# Patient Record
Sex: Male | Born: 1960 | Race: Black or African American | Hispanic: No | Marital: Single
Health system: Southern US, Community
[De-identification: ages and names within clinical notes are randomized; demographics above are authoritative.]

---

## 2009-11-28 ENCOUNTER — Emergency Department: Payer: Self-pay | Admitting: Emergency Medicine

## 2012-01-24 ENCOUNTER — Ambulatory Visit: Payer: Self-pay | Admitting: Unknown Physician Specialty

## 2012-01-24 LAB — BASIC METABOLIC PANEL
Anion Gap: 10 (ref 7–16)
Chloride: 107 mmol/L (ref 98–107)
Co2: 23 mmol/L (ref 21–32)
Creatinine: 0.75 mg/dL (ref 0.60–1.30)
EGFR (African American): >60
EGFR (Non-African Amer.): >60
Glucose: 70 mg/dL (ref 65–99)

## 2012-01-24 LAB — HEMOGLOBIN: HGB: 13 g/dL (ref 13.0–18.0)

## 2012-01-31 ENCOUNTER — Ambulatory Visit: Payer: Self-pay | Admitting: Unknown Physician Specialty

## 2012-02-01 LAB — PATHOLOGY REPORT

## 2012-02-16 ENCOUNTER — Ambulatory Visit: Payer: Self-pay | Admitting: Oncology

## 2012-02-16 LAB — CBC CANCER CENTER
Basophil #: 0 x10 3/mm (ref 0.0–0.1)
Eosinophil #: 0.2 x10 3/mm (ref 0.0–0.7)
Lymphocyte #: 3.2 x10 3/mm (ref 1.0–3.6)
Lymphocyte %: 42.8 %
MCHC: 32.2 g/dL (ref 32.0–36.0)
MCV: 91 fL (ref 80–100)
Monocyte %: 5.5 %
Neutrophil #: 3.6 x10 3/mm (ref 1.4–6.5)
Platelet: 192 x10 3/mm (ref 150–440)
RBC: 4.57 10*6/uL (ref 4.40–5.90)
RDW: 14.6 % — ABNORMAL HIGH (ref 11.5–14.5)
WBC: 7.4 x10 3/mm (ref 3.8–10.6)

## 2012-02-16 LAB — COMPREHENSIVE METABOLIC PANEL
Albumin: 3.8 g/dL (ref 3.4–5.0)
Alkaline Phosphatase: 110 U/L (ref 50–136)
Anion Gap: 5 — ABNORMAL LOW (ref 7–16)
BUN: 7 mg/dL (ref 7–18)
Co2: 25 mmol/L (ref 21–32)
Glucose: 80 mg/dL (ref 65–99)
Osmolality: 273 (ref 275–301)
Potassium: 3.8 mmol/L (ref 3.5–5.1)
SGOT(AST): 23 U/L (ref 15–37)
SGPT (ALT): 39 U/L (ref 12–78)
Total Protein: 7.2 g/dL (ref 6.4–8.2)

## 2012-02-23 ENCOUNTER — Ambulatory Visit: Payer: Self-pay | Admitting: Oncology

## 2012-03-06 ENCOUNTER — Ambulatory Visit: Payer: Self-pay | Admitting: Oncology

## 2012-03-19 ENCOUNTER — Ambulatory Visit: Payer: Self-pay | Admitting: Oncology

## 2012-03-19 ENCOUNTER — Ambulatory Visit: Payer: Self-pay | Admitting: Radiation Oncology

## 2012-03-29 LAB — CBC CANCER CENTER
Basophil #: 0.1 x10 3/mm (ref 0.0–0.1)
Basophil %: 0.8 %
Eosinophil #: 0.1 x10 3/mm (ref 0.0–0.7)
HCT: 37.4 % — ABNORMAL LOW (ref 40.0–52.0)
Lymphocyte %: 33.3 %
MCH: 29.6 pg (ref 26.0–34.0)
MCHC: 32.6 g/dL (ref 32.0–36.0)
Monocyte #: 0.3 x10 3/mm (ref 0.2–1.0)
Neutrophil #: 4.4 x10 3/mm (ref 1.4–6.5)
Platelet: 180 x10 3/mm (ref 150–440)
RDW: 14.1 % (ref 11.5–14.5)
WBC: 7.4 x10 3/mm (ref 3.8–10.6)

## 2012-04-06 ENCOUNTER — Ambulatory Visit: Payer: Self-pay | Admitting: Oncology

## 2012-04-12 LAB — CBC CANCER CENTER
Eosinophil #: 0.1 x10 3/mm (ref 0.0–0.7)
Eosinophil %: 1.7 %
HGB: 11.7 g/dL — ABNORMAL LOW (ref 13.0–18.0)
Lymphocyte #: 2.4 x10 3/mm (ref 1.0–3.6)
Lymphocyte %: 32 %
MCH: 29.9 pg (ref 26.0–34.0)
MCHC: 32.7 g/dL (ref 32.0–36.0)
Monocyte #: 0.4 x10 3/mm (ref 0.2–1.0)
Neutrophil %: 60 %
Platelet: 213 x10 3/mm (ref 150–440)
RBC: 3.93 10*6/uL — ABNORMAL LOW (ref 4.40–5.90)
RDW: 13.9 % (ref 11.5–14.5)

## 2012-04-19 LAB — CBC CANCER CENTER
Basophil %: 0.6 %
Eosinophil #: 0.1 x10 3/mm (ref 0.0–0.7)
Eosinophil %: 2.5 %
HCT: 39.5 % — ABNORMAL LOW (ref 40.0–52.0)
HGB: 12.7 g/dL — ABNORMAL LOW (ref 13.0–18.0)
Lymphocyte %: 25.5 %
MCH: 29.6 pg (ref 26.0–34.0)
MCHC: 32 g/dL (ref 32.0–36.0)
Monocyte %: 6.5 %
Neutrophil #: 3.5 x10 3/mm (ref 1.4–6.5)
RBC: 4.28 10*6/uL — ABNORMAL LOW (ref 4.40–5.90)
WBC: 5.4 x10 3/mm (ref 3.8–10.6)

## 2012-04-27 LAB — CBC CANCER CENTER
Basophil #: 0 x10 3/mm (ref 0.0–0.1)
Basophil %: 0.6 %
Eosinophil #: 0.2 x10 3/mm (ref 0.0–0.7)
HCT: 39.3 % — ABNORMAL LOW (ref 40.0–52.0)
HGB: 12.9 g/dL — ABNORMAL LOW (ref 13.0–18.0)
Lymphocyte %: 22.6 %
MCH: 30.3 pg (ref 26.0–34.0)
MCHC: 32.7 g/dL (ref 32.0–36.0)
Monocyte #: 0.6 x10 3/mm (ref 0.2–1.0)
Monocyte %: 7.9 %
Neutrophil %: 66.7 %
Platelet: 204 x10 3/mm (ref 150–440)
RBC: 4.25 10*6/uL — ABNORMAL LOW (ref 4.40–5.90)
WBC: 7.1 x10 3/mm (ref 3.8–10.6)

## 2012-05-06 ENCOUNTER — Ambulatory Visit: Payer: Self-pay | Admitting: Oncology

## 2012-05-10 LAB — CBC CANCER CENTER
Basophil #: 0 x10 3/mm (ref 0.0–0.1)
Basophil %: 0.6 %
Eosinophil #: 0.1 x10 3/mm (ref 0.0–0.7)
HCT: 38.9 % — ABNORMAL LOW (ref 40.0–52.0)
HGB: 13.2 g/dL (ref 13.0–18.0)
Lymphocyte #: 1.6 x10 3/mm (ref 1.0–3.6)
Lymphocyte %: 32.6 %
MCH: 31 pg (ref 26.0–34.0)
Monocyte %: 6.8 %
Neutrophil #: 2.9 x10 3/mm (ref 1.4–6.5)
Platelet: 200 x10 3/mm (ref 150–440)
RBC: 4.28 10*6/uL — ABNORMAL LOW (ref 4.40–5.90)

## 2012-06-06 ENCOUNTER — Ambulatory Visit: Payer: Self-pay | Admitting: Oncology

## 2012-06-14 LAB — CBC CANCER CENTER
Basophil #: 0 x10 3/mm (ref 0.0–0.1)
Eosinophil #: 0.1 x10 3/mm (ref 0.0–0.7)
Eosinophil %: 2.2 %
HCT: 39.3 % — ABNORMAL LOW (ref 40.0–52.0)
Lymphocyte %: 25.5 %
MCHC: 34 g/dL (ref 32.0–36.0)
MCV: 90 fL (ref 80–100)
Monocyte #: 0.4 x10 3/mm (ref 0.2–1.0)
Monocyte %: 6.4 %
RBC: 4.38 10*6/uL — ABNORMAL LOW (ref 4.40–5.90)
RDW: 14.2 % (ref 11.5–14.5)
WBC: 5.7 x10 3/mm (ref 3.8–10.6)

## 2012-07-07 ENCOUNTER — Ambulatory Visit: Payer: Self-pay | Admitting: Oncology

## 2012-07-26 ENCOUNTER — Emergency Department: Payer: Self-pay | Admitting: Emergency Medicine

## 2012-07-26 LAB — COMPREHENSIVE METABOLIC PANEL
Albumin: 2.6 g/dL — ABNORMAL LOW (ref 3.4–5.0)
BUN: 6 mg/dL — ABNORMAL LOW (ref 7–18)
Calcium, Total: 9.2 mg/dL (ref 8.5–10.1)
Glucose: 131 mg/dL — ABNORMAL HIGH (ref 65–99)
Osmolality: 255 (ref 275–301)

## 2012-07-26 LAB — CBC
MCH: 29.7 pg (ref 26.0–34.0)
MCHC: 33.7 g/dL (ref 32.0–36.0)
MCV: 88 fL (ref 80–100)
RDW: 13.6 % (ref 11.5–14.5)

## 2012-07-26 LAB — TROPONIN I: Troponin-I: <0.02 ng/mL

## 2012-07-26 LAB — CK TOTAL AND CKMB (NOT AT ARMC): CK-MB: <0.5 ng/mL — ABNORMAL LOW (ref 0.5–3.6)

## 2012-07-27 ENCOUNTER — Emergency Department: Payer: Self-pay | Admitting: Emergency Medicine

## 2012-08-01 LAB — CULTURE, BLOOD (SINGLE)

## 2012-08-05 ENCOUNTER — Emergency Department: Payer: Self-pay | Admitting: Internal Medicine

## 2012-09-04 DEATH — deceased

## 2013-03-27 IMAGING — CT CT OUTSIDE FILMS BODY
1 series · 16 of 32 positions shown, 20 images · non-contrast
Comparison: none

[Series 2: soft tissue (id) x (id) · axial · 0.98mm/px · z∈[-112,+155]mm · 16 of 119 slices shown, 20 images]
[im 8/119  soft-tissue]
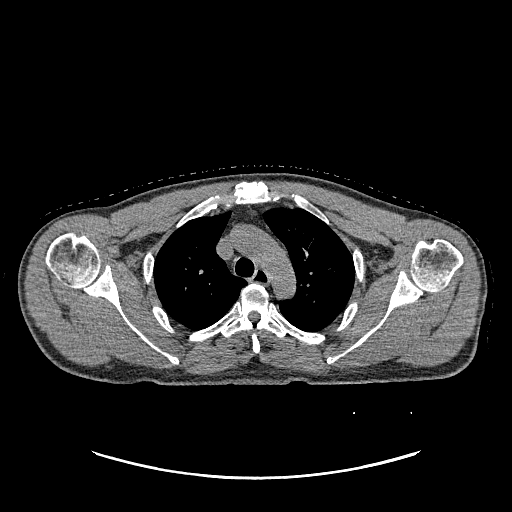
[im 8/119  bone]
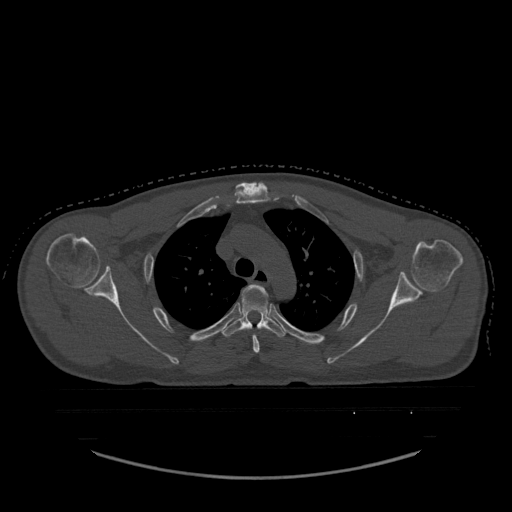
[im 16/119  soft-tissue]
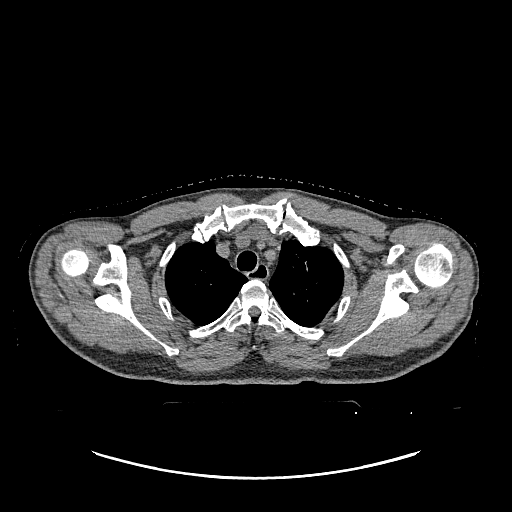
[im 23/119  soft-tissue]
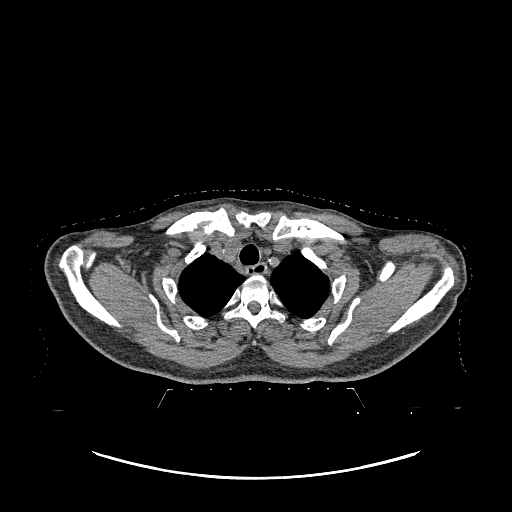
[im 31/119  soft-tissue]
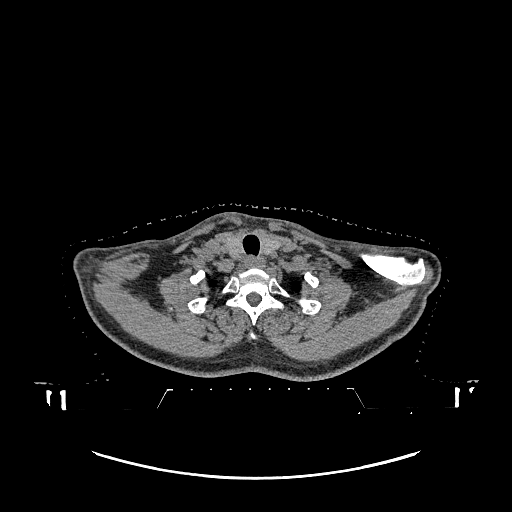
[im 39/119  soft-tissue]
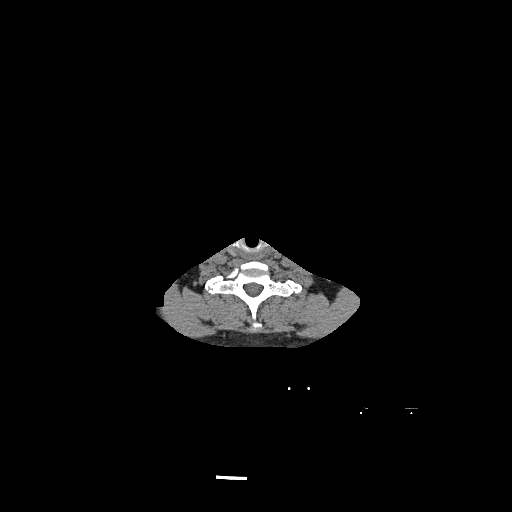
[im 46/119  soft-tissue]
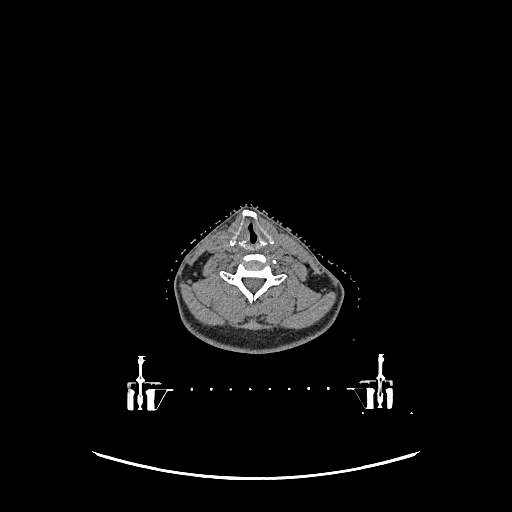
[im 54/119  soft-tissue]
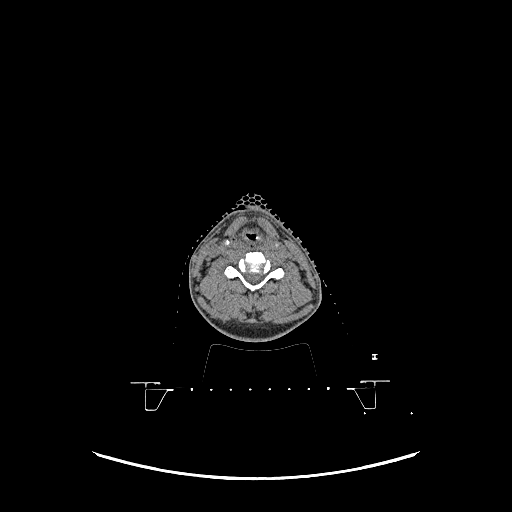
[im 65/119  soft-tissue]
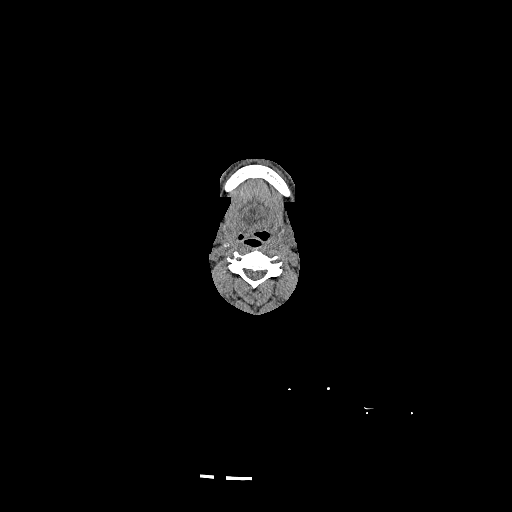
[im 73/119  soft-tissue]
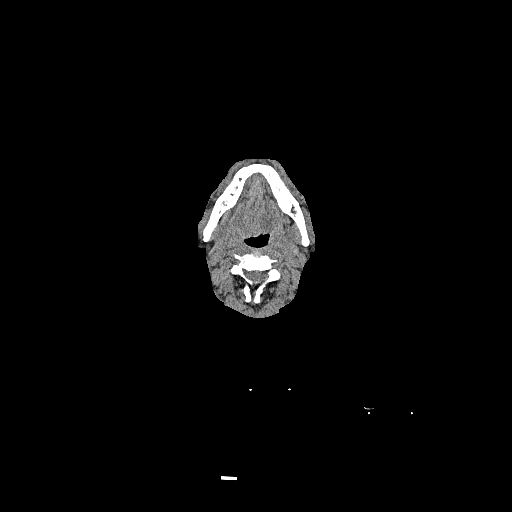
[im 73/119  bone]
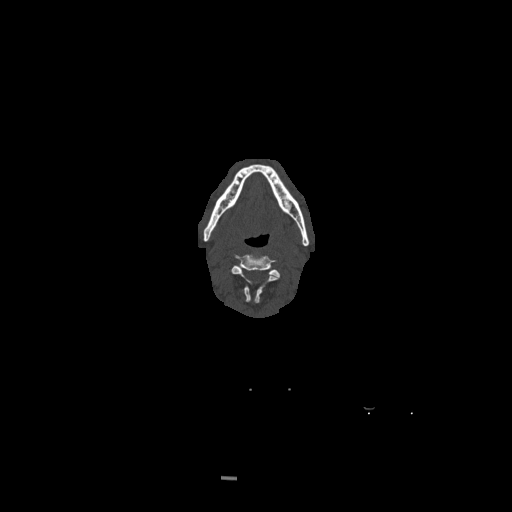
[im 80/119  soft-tissue]
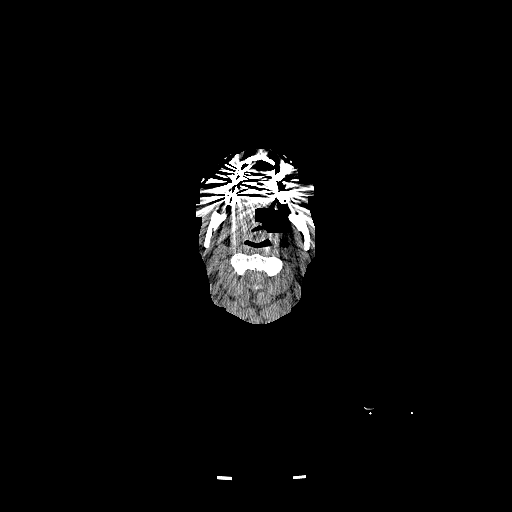
[im 88/119  soft-tissue]
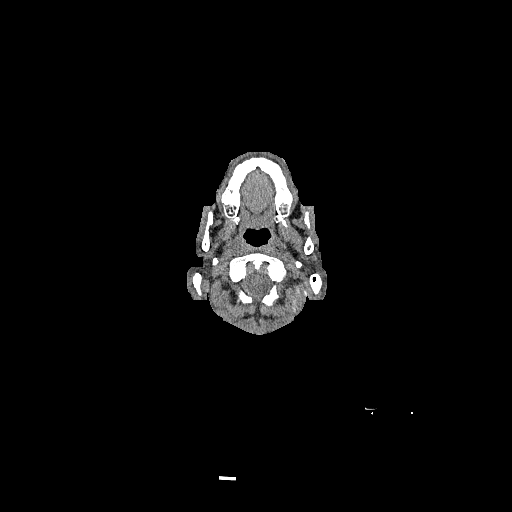
[im 96/119  soft-tissue]
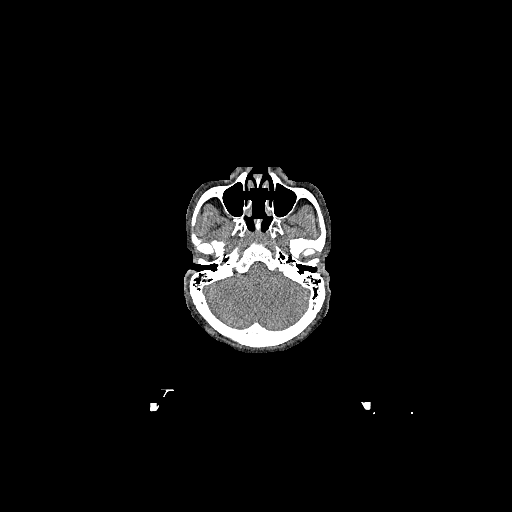
[im 103/119  soft-tissue]
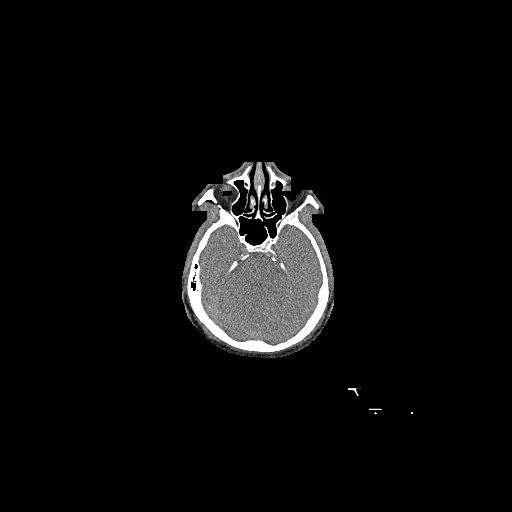
[im 103/119  lung]
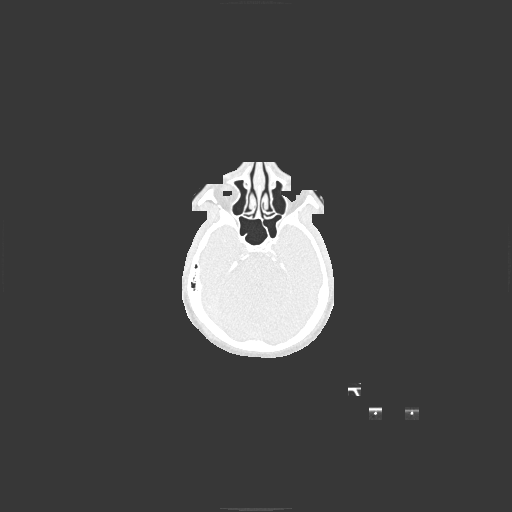
[im 107/119  lung]
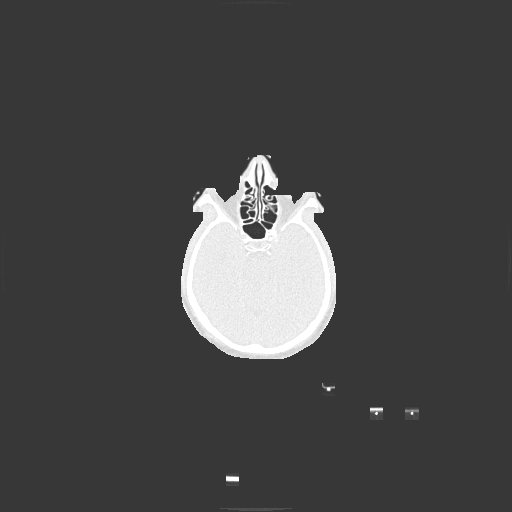
[im 111/119  soft-tissue]
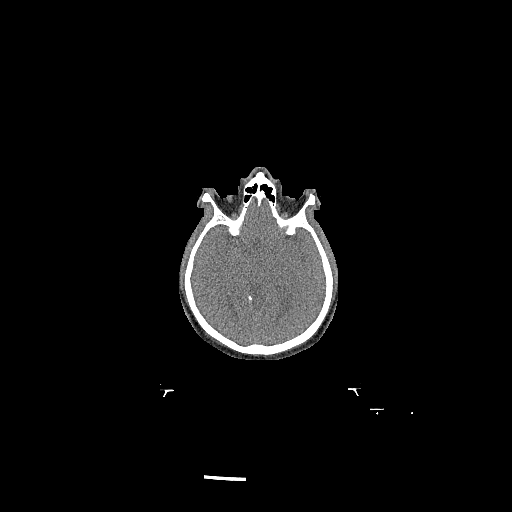
[im 111/119  lung]
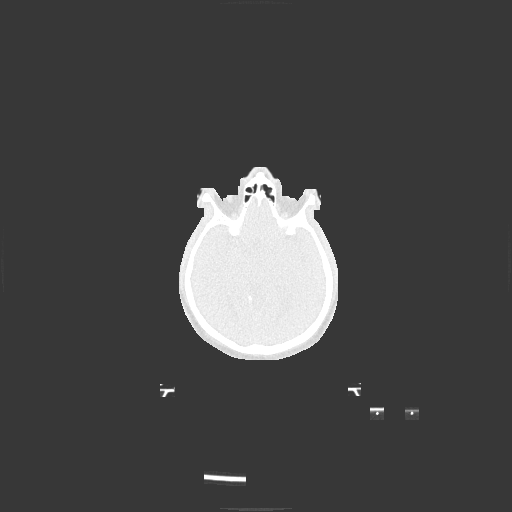
[im 115/119  lung]
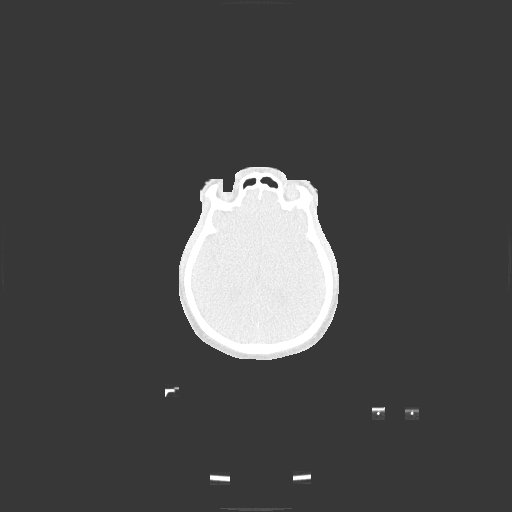

[16 of 32 positions shown; findings below may reference images not displayed]

**** An original report or order could not be provided from the [HOSPITAL] [facility] ****

## 2014-01-15 IMAGING — CT CT NECK-CHEST W/ CM
1 series · 12 of 14 positions shown, 15 images · IV contrast (APPLIED)
Comparison: none

REASON FOR EXAM: dyspnea, tachycardia, stridor, cancer patient.  Please
time contrast bolus to ev
COMMENTS:

[Series 4: soft tissue · axial · 0.74mm/px · z∈[-705,-276]mm · 12 of 170 slices shown, 15 images]
[im 14/170  soft-tissue]
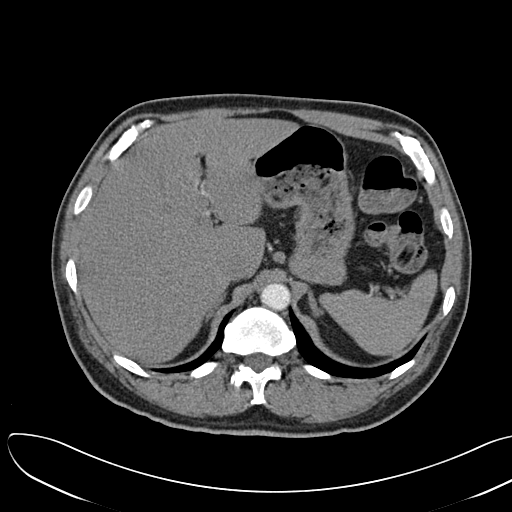
[im 14/170  bone]
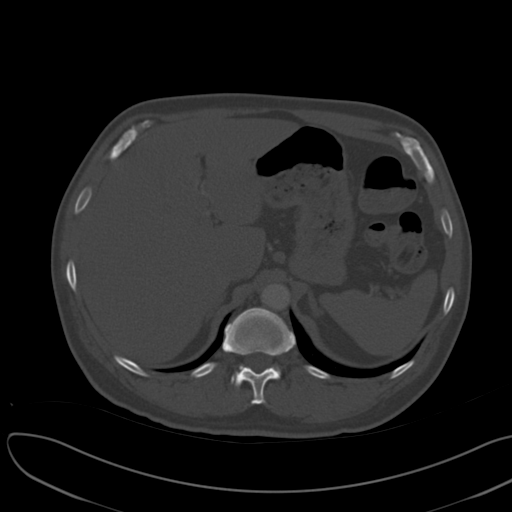
[im 27/170  bone]
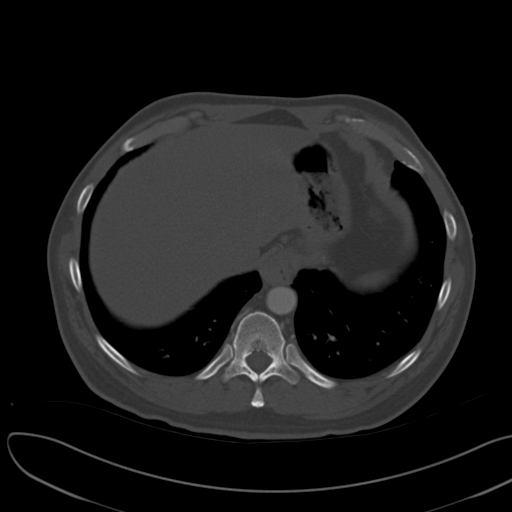
[im 40/170  bone]
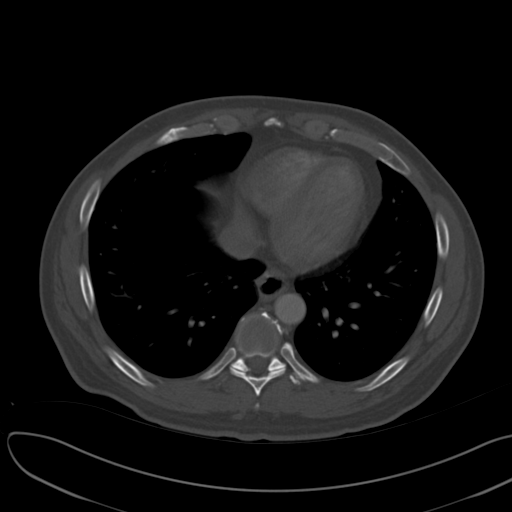
[im 53/170  bone]
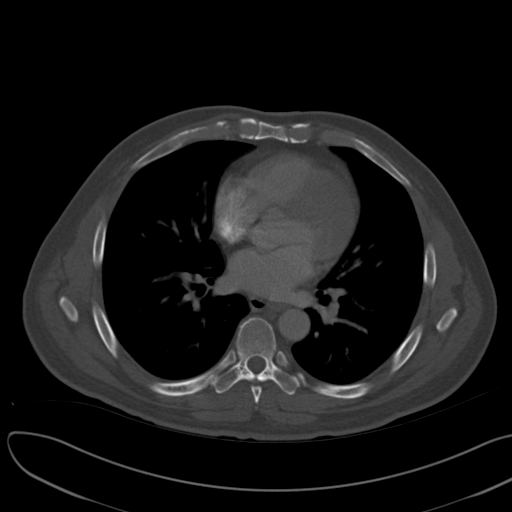
[im 66/170  soft-tissue]
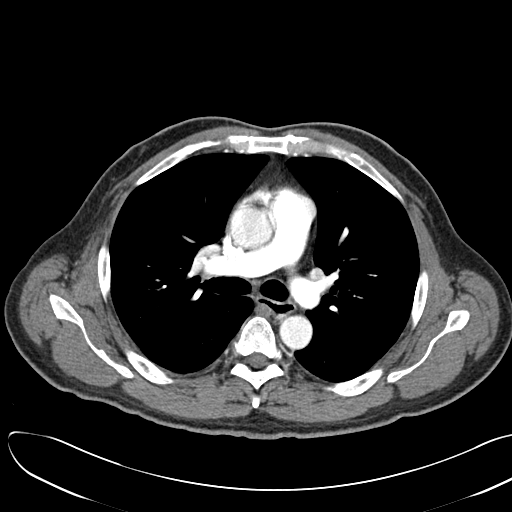
[im 66/170  bone]
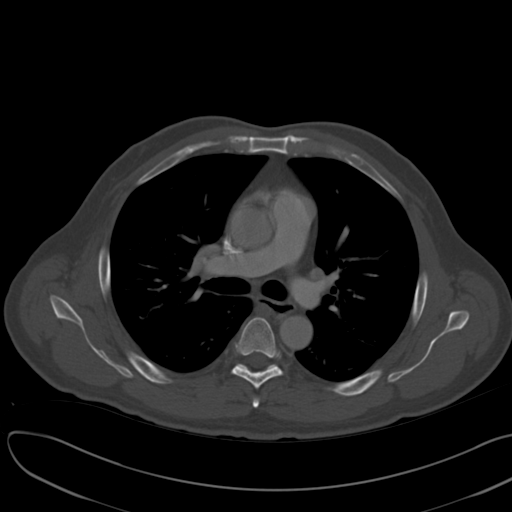
[im 79/170  bone]
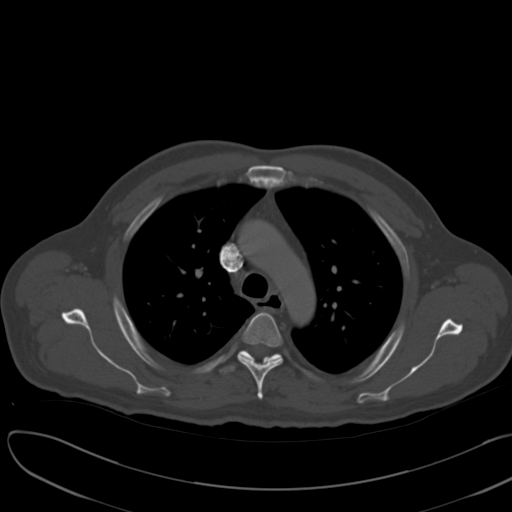
[im 92/170  bone]
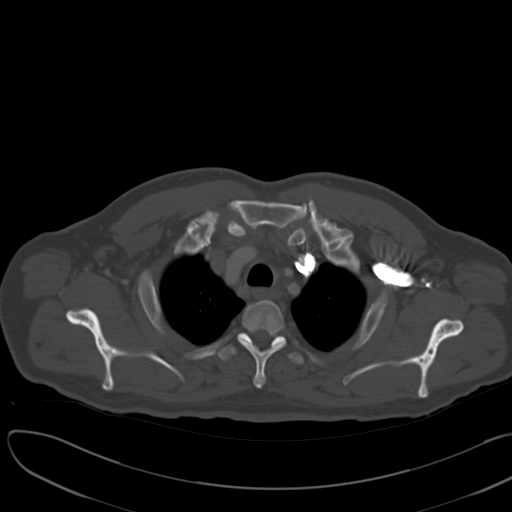
[im 105/170  bone]
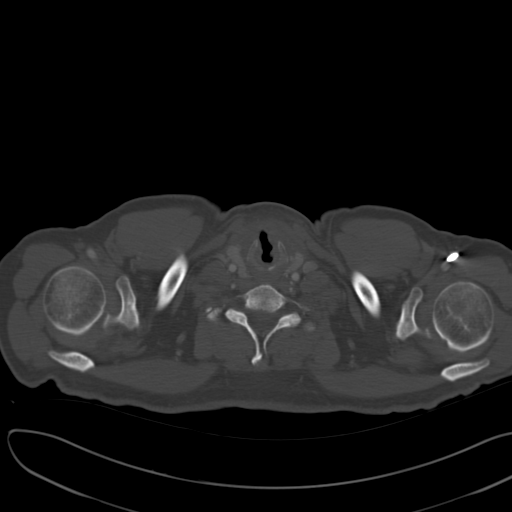
[im 118/170  soft-tissue]
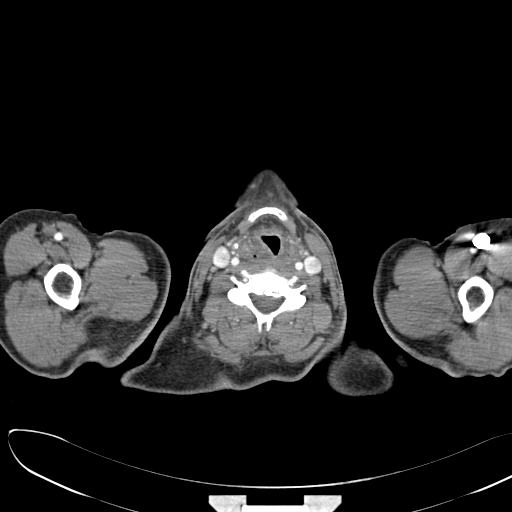
[im 118/170  bone]
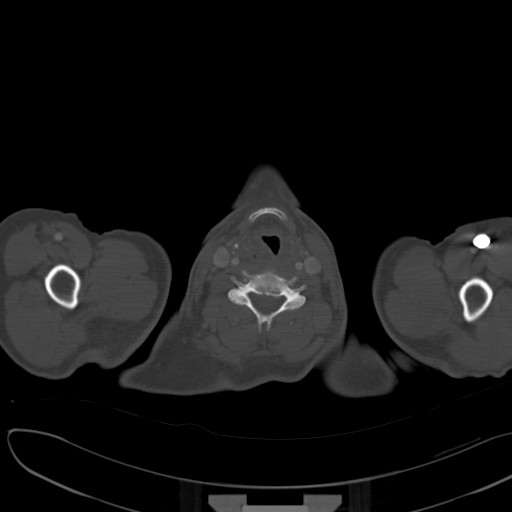
[im 131/170  bone]
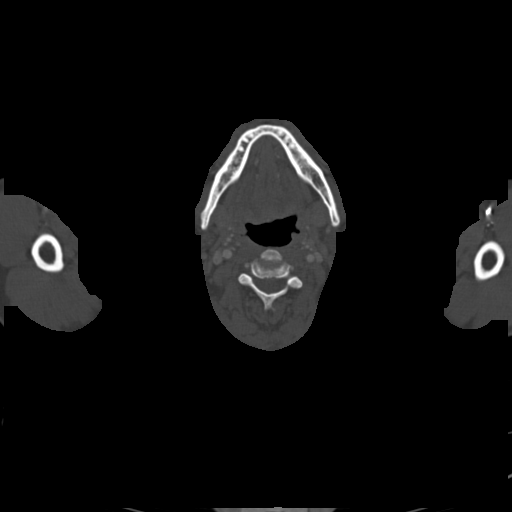
[im 144/170  bone]
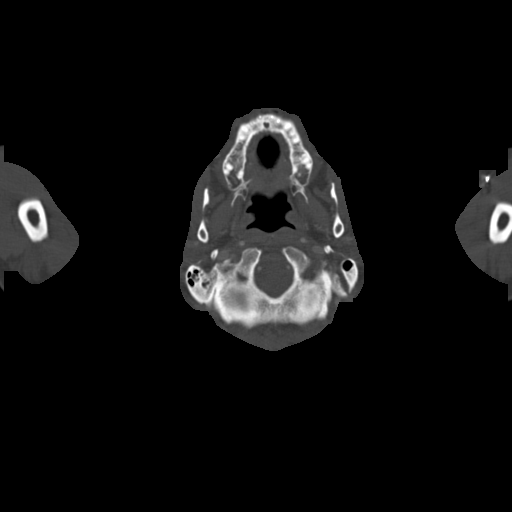
[im 157/170  bone]
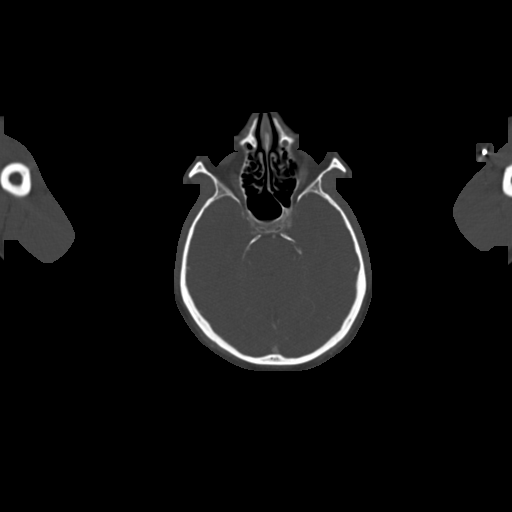

[12 of 14 positions shown; findings below may reference images not displayed]

PROCEDURE:     CT  - CT NECK AND CHEST W  - July 26, 2012  [DATE]

RESULT:     CT of the neck is performed emergently with 100 mL of Rsovue-[id number]
iodinated intravenous contrast with images reconstructed at 3.0 mm slice
thickness in the axial plane. The study is continued through the chest.
There is soft tissue fullness in the right larynx region which is concerning
for underlying malignant disease. Correlate with direct visualization. This
extends in the supraglottic region slightly narrowing the upper airway. The
subglottic region shows no definite narrowing. The trachea is midline. No
central airway mass is present. There is a small hiatal hernia suggested.
The heart is not enlarged. There is no pleural or pericardial effusion.
There is no mediastinal or hilar adenopathy. There is no definite cervical
adenopathy. The parotid and submandibular salivary glands appear to be
normal. The base of the tongue is unremarkable. The tonsillar regions appear
to be within normal limits. The remaining of portions of the nasopharynx and
oropharynx appear to be unremarkable. There is artifact from the patient's
dental work. The included sinuses and mastoid air cells show normal
aeration. The base of the brain and orbits are unremarkable. The thyroid
lobes enhance homogeneously. There is no definite pulmonary mass. There is
some minimal nodularity in the right middle lobe region with a long axis of
4.3 mm. Followup on subsequent studies is recommended. The thoracic aortic
appears normal in caliber. The pulmonary arteries appear to be unremarkable
without a definite filling defect. The included upper abdominal structures
appear within normal limits.
IMPRESSION: 1. Findings concerning for a mass in the right laryngeal and inferior
supraglottic region. ENT correlation with direct visualization is
recommended. There does not appear to be significant cervical adenopathy.
2. Minimal nodularity in the right middle lobe anteriorly which is
nonspecific. Followup on subsequent studies is recommended. The lungs are
otherwise clear.

[REDACTED]
1.

## 2014-01-25 IMAGING — CR DG CHEST 2V
1 series · 2 of 2 positions shown · non-contrast
Comparison: none

REASON FOR EXAM: cough and short of breath     flex 42
COMMENTS:   LMP: (Male)

PROCEDURE:     DXR - DXR CHEST PA (OR AP) AND LATERAL  - August 05, 2012  [DATE]
RESULT:     Comparison: None

[Series 3: w chest pa · 0.14mm/px · 2 of 2 slices shown]
[im 1/2]
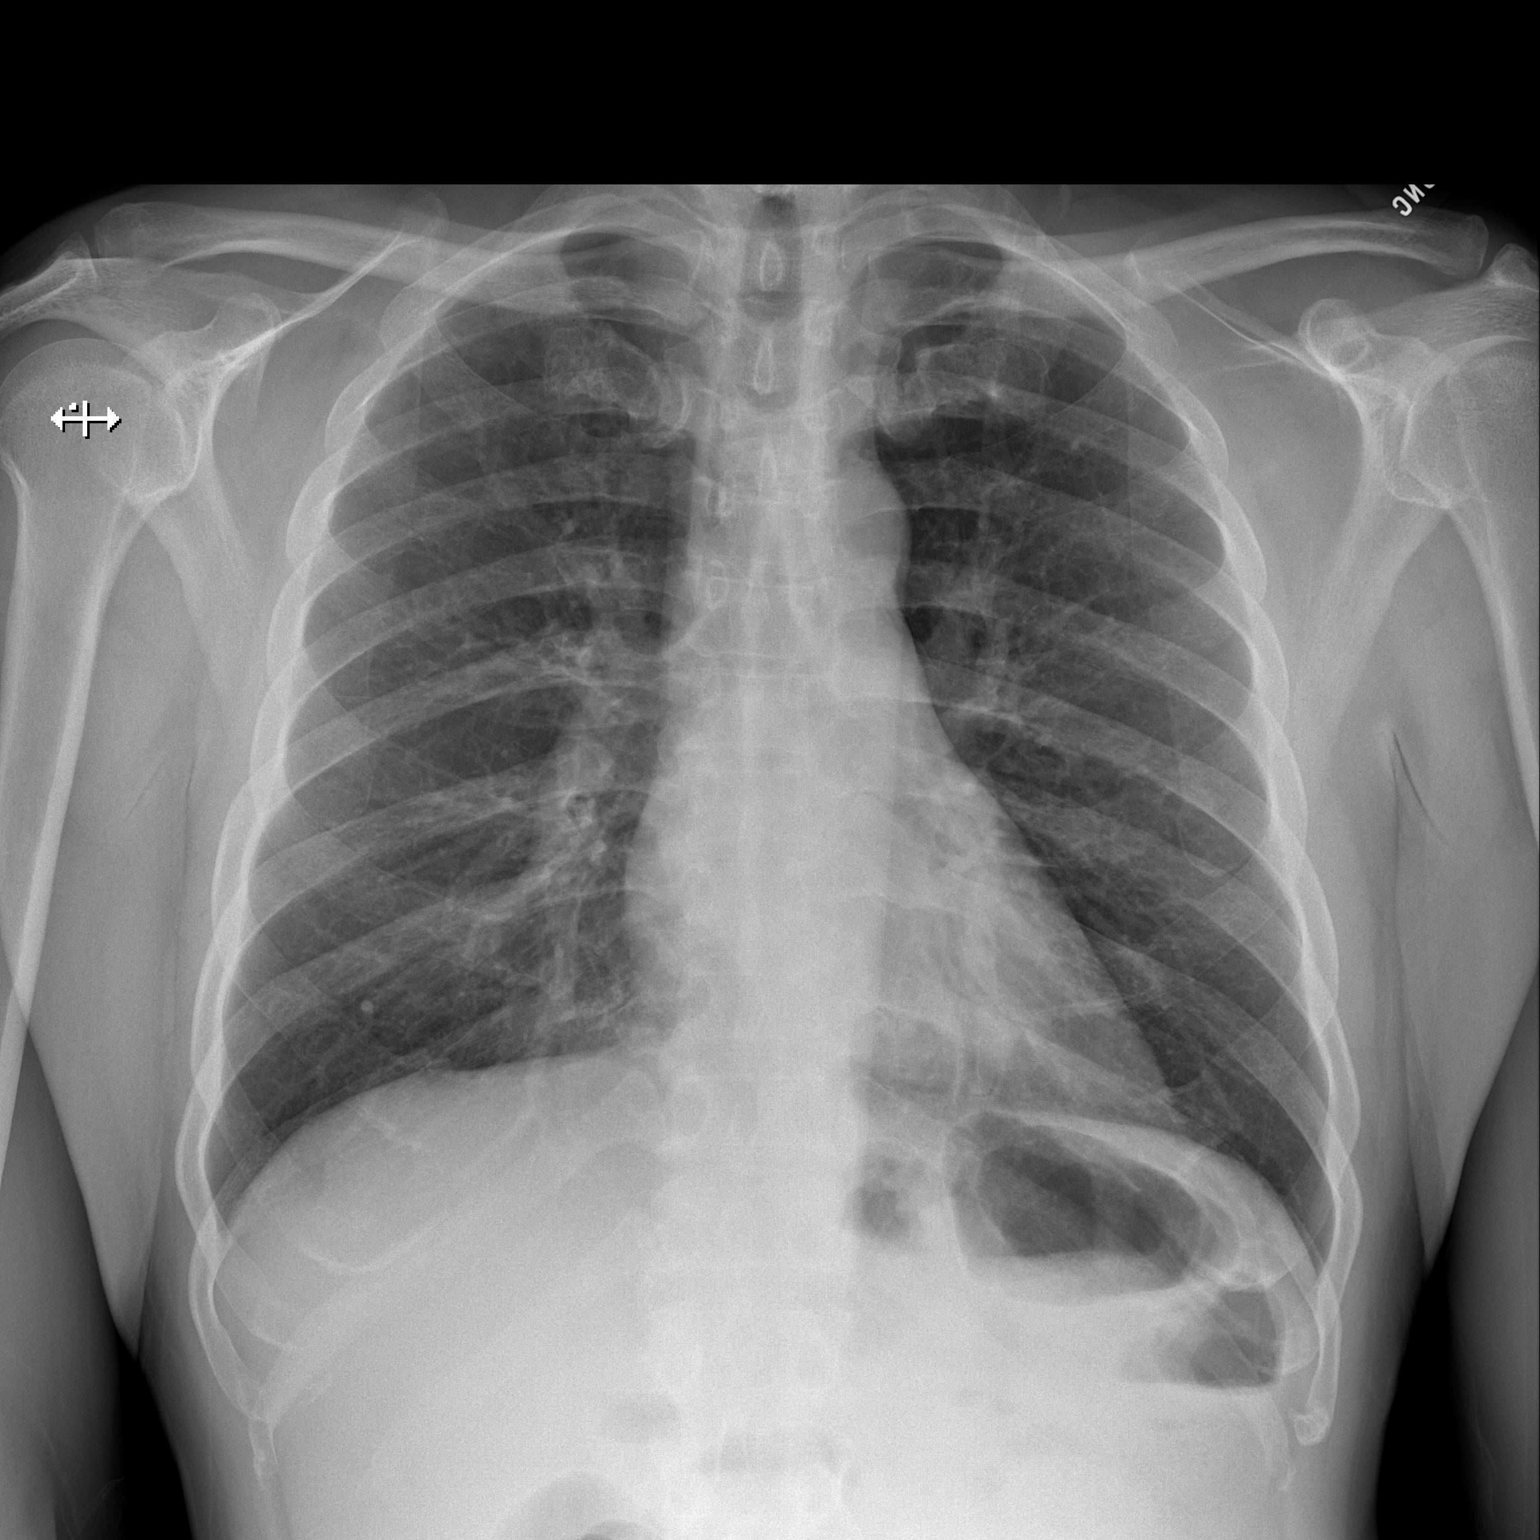
[im 2/2]
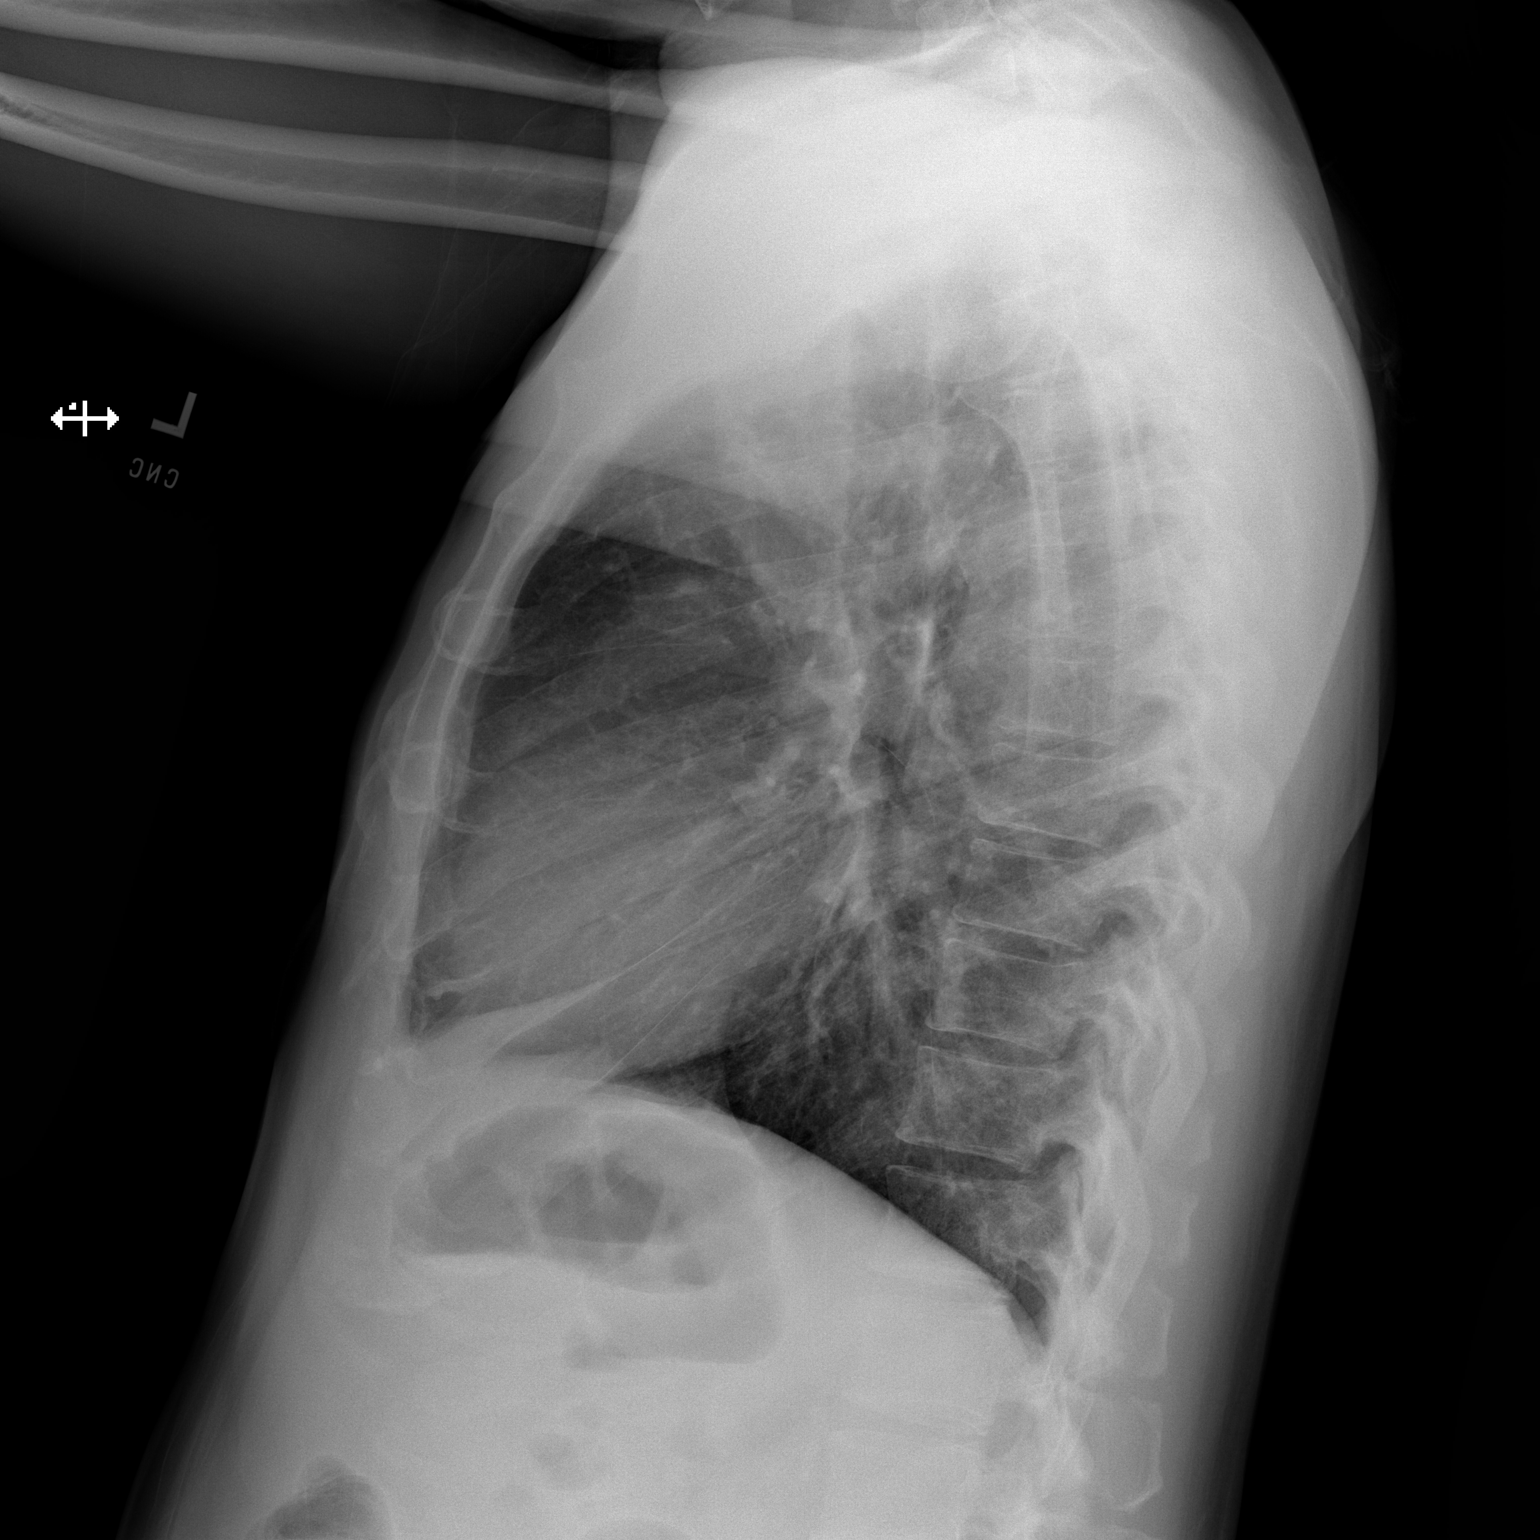

[2 of 2 positions shown; findings below may reference images not displayed]

FINDINGS: PA and lateral chest radiographs are provided.  There is no focal
parenchymal opacity, pleural effusion, or pneumothorax. The heart and
mediastinum are unremarkable.  The osseous structures are unremarkable.
IMPRESSION: No acute disease of the che[REDACTED]

## 2014-09-23 NOTE — Op Note (Signed)
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  POSTOPERATIVE DIAGNOSIS: Right anterior vocal cord lesion.  OPERATION PERFORMED: Microlaryngoscopy with biopsy of right vocal cord, left vocal cord, and anterior commissure.   SURGEON: Davina Pokehapman T. Tatem Holsonback, M.D.   OPERATIVE FINDINGS: Ulcerative granulation tissue appearing in the right anterior vocal fold extending to the commissure and mild polypoid disease of the left vocal fold.  DESCRIPTION OF PROCEDURE: James Davies was identified in the holding area and taken to the Operating Room and placed in the supine position. After general endotracheal anesthesia, the table was turned 90 degrees. A tooth guard was then placed. A Dedo laryngoscope was introduced into the airway and suspended. A 0 degree Hopkins rod was used to examine the larynx. There appeared to be friable granulation ulcer type tissue of the right anterior vocal fold extending to the anterior commissure. A Cottonoid pledget with phenylephrine lidocaine was placed in the larynx. After five minutes this was removed. Prior to this, the 0 degree Hopkins rod was removed after photodocumentation was performed. The microscope was brought into the field. Using the microscope and the 45 degree forceps, multiple biopsies were taken of the right anterior vocal fold, of the right aspect of the anterior commissure, and a biopsy was taken on the left of a polypoid area just above the true cord on the false vocal cord. With these biopsies taken, Cottonoid pledgets were replaced. Photodocumentation was again taken using the Hopkins rod. The microscope was then swung out of position. The suspension apparatus was taken down and the Dedo laryngoscope was used to examine the piriform sinuses bilaterally; they were clear. The posterior glottis was also examined behind the endotracheal tube; this was also  clear. The patient was then returned to anesthesia where he was extubated in the Operating Room and taken to the Recovery Room in stable condition.       CULTURES: None.   SPECIMENS: Right anterior vocal fold, anterior commissure, and left vocal fold.   ESTIMATED BLOOD LOSS: Less than 5 mL. ____________________________ Davina Pokehapman T. Tanaya Dunigan, MD [id number] D: 01/31/2012 10:35:55 ET T: 01/31/2012 11:07:28 ET JOB#: [id number]  cc: Davina Pokehapman T. Vinton Layson, MD, <Dictator> Davina PokeHAPMAN T Chequita Mofield MD ELECTRONICALLY SIGNED 02/03/2012 8:46
